# Patient Record
Sex: Female | Born: 2000 | Race: White | Hispanic: Yes | Marital: Single | Smoking: Never smoker
Health system: Southern US, Community
[De-identification: ages and names within clinical notes are randomized; demographics above are authoritative.]

## PROBLEM LIST (undated history)

## (undated) HISTORY — PX: APPENDECTOMY: SHX54

---

## 2020-12-30 ENCOUNTER — Encounter (HOSPITAL_COMMUNITY): Payer: Self-pay

## 2020-12-30 ENCOUNTER — Other Ambulatory Visit: Payer: Self-pay

## 2020-12-30 ENCOUNTER — Emergency Department (HOSPITAL_COMMUNITY)
Admission: EM | Admit: 2020-12-30 | Discharge: 2020-12-31 | Disposition: A | Discharge status: Home / Self Care | Payer: Medicaid Other | Attending: Emergency Medicine | Admitting: Emergency Medicine

## 2020-12-30 DIAGNOSIS — K0889 Other specified disorders of teeth and supporting structures: Secondary | ICD-10-CM | POA: Insufficient documentation

## 2020-12-30 DIAGNOSIS — R1310 Dysphagia, unspecified: Secondary | ICD-10-CM | POA: Insufficient documentation

## 2020-12-30 DIAGNOSIS — R6884 Jaw pain: Secondary | ICD-10-CM | POA: Diagnosis present

## 2020-12-30 LAB — CBC WITH DIFFERENTIAL/PLATELET
Abs Immature Granulocytes: 0.03 10*3/uL (ref 0.00–0.07)
Basophils Absolute: 0.1 10*3/uL (ref 0.0–0.1)
Basophils Relative: 1 %
Eosinophils Absolute: 0.1 10*3/uL (ref 0.0–0.5)
Eosinophils Relative: 1 %
HCT: 40.3 % (ref 36.0–46.0)
Hemoglobin: 13.3 g/dL (ref 12.0–15.0)
Immature Granulocytes: 0 %
Lymphocytes Relative: 27 %
Lymphs Abs: 2.4 10*3/uL (ref 0.7–4.0)
MCH: 30.9 pg (ref 26.0–34.0)
MCHC: 33 g/dL (ref 30.0–36.0)
MCV: 93.7 fL (ref 80.0–100.0)
Monocytes Absolute: 0.6 10*3/uL (ref 0.1–1.0)
Monocytes Relative: 6 %
Neutro Abs: 5.7 10*3/uL (ref 1.7–7.7)
Neutrophils Relative %: 65 %
Platelets: 299 10*3/uL (ref 150–400)
RBC: 4.3 MIL/uL (ref 3.87–5.11)
RDW: 12.8 % (ref 11.5–15.5)
WBC: 8.8 10*3/uL (ref 4.0–10.5)
nRBC: 0 % (ref 0.0–0.2)

## 2020-12-30 LAB — BASIC METABOLIC PANEL
Anion gap: 11 (ref 5–15)
BUN: 6 mg/dL (ref 6–20)
CO2: 23 mmol/L (ref 22–32)
Calcium: 9.7 mg/dL (ref 8.9–10.3)
Chloride: 103 mmol/L (ref 98–111)
Creatinine, Ser: 0.78 mg/dL (ref 0.44–1.00)
GFR, Estimated: >60 mL/min (ref >60)
Glucose, Bld: 98 mg/dL (ref 70–99)
Potassium: 3.6 mmol/L (ref 3.5–5.1)
Sodium: 137 mmol/L (ref 135–145)

## 2020-12-30 MED ORDER — HYDROCODONE-ACETAMINOPHEN 5-325 MG PO TABS
1.0000 | ORAL_TABLET | Freq: Once | ORAL | Status: AC
  Administered 2020-12-30: 1 via ORAL
  Filled 2020-12-30: qty 1

## 2020-12-30 NOTE — ED Triage Notes (Signed)
Patient reports wisdom teeth removal in December, has not been the same since, has not been able to eat food x 3 days, states upper mouth swelling and pain going into her throat.

## 2020-12-30 NOTE — ED Provider Notes (Signed)
MSE was initiated and I personally evaluated the patient and placed orders (if any) at  10:34 PM on December 30, 2020.  Right jaw pain, worsening over the last 3 days. Surgery in December - wisdom teeth removed. Has had some pain every since. No fever.   There were no vitals filed for this visit. There is no height or weight on file to calculate BMI.  Oropharynx benign Tender over lower rear jaw, no swelling or redness Tenderness extends into the neck  The patient appears stable so that the remainder of the MSE may be completed by another provider.   Elpidio Anis, PA-C 12/30/20 2238    Tilden Fossa, MD 12/30/20 [phone]

## 2020-12-31 ENCOUNTER — Emergency Department (HOSPITAL_COMMUNITY): Payer: Medicaid Other

## 2020-12-31 LAB — I-STAT BETA HCG BLOOD, ED (MC, WL, AP ONLY): I-stat hCG, quantitative: <5 m[IU]/mL (ref <5)

## 2020-12-31 MED ORDER — SODIUM CHLORIDE 0.9 % IV BOLUS
1000.0000 mL | Freq: Once | INTRAVENOUS | Status: AC
  Administered 2020-12-31: 1000 mL via INTRAVENOUS

## 2020-12-31 MED ORDER — TRAMADOL HCL 50 MG PO TABS: 50.0000 mg | ORAL_TABLET | Freq: Four times a day (QID) | ORAL | 0 refills | Status: AC | PRN

## 2020-12-31 MED ORDER — KETOROLAC TROMETHAMINE 30 MG/ML IJ SOLN
15.0000 mg | Freq: Once | INTRAMUSCULAR | Status: AC
  Administered 2020-12-31: 15 mg via INTRAVENOUS
  Filled 2020-12-31: qty 1

## 2020-12-31 MED ORDER — NYSTATIN 100000 UNIT/ML MT SUSP
5.0000 mL | Freq: Once | OROMUCOSAL | Status: AC
  Administered 2020-12-31: 500000 [IU] via ORAL
  Filled 2020-12-31: qty 5

## 2020-12-31 MED ORDER — NYSTATIN 100000 UNIT/ML MT SUSP: 500000.0000 [IU] | Freq: Four times a day (QID) | OROMUCOSAL | 0 refills | Status: AC

## 2020-12-31 MED ORDER — IOHEXOL 300 MG/ML  SOLN
100.0000 mL | Freq: Once | INTRAMUSCULAR | Status: AC | PRN
  Administered 2020-12-31: 100 mL via INTRAVENOUS

## 2020-12-31 MED ORDER — HYDROMORPHONE HCL 1 MG/ML IJ SOLN
0.5000 mg | Freq: Once | INTRAMUSCULAR | Status: AC
  Administered 2020-12-31: 0.5 mg via INTRAVENOUS
  Filled 2020-12-31: qty 1

## 2020-12-31 NOTE — ED Provider Notes (Signed)
[facility] EMERGENCY DEPARTMENT Provider Note   CSN: [id number] Arrival date & time: 12/30/20  2159     History Chief Complaint  Patient presents with  . Dysphagia  . Dental Pain    Christina Mclean is a 20 y.o. female.  Patient without significant medical history presents with right severe jaw pain that radiates to neck, ear and proximal shoulder. She reports having her wisdom teeth removed last December and has been having persistent pain since that time. Over the last several days, the pain has become severe and radiating. No facial swelling. She has had follow up with the oral surgeon who felt she might be clenching her jaw but was not concerned for infection or post-operative complication. She has a pending appointment with dentistry for May 3rd but the pain has become unbearable and now includes burning and discoloration of the roof of her mouth. No fever at any point.   The history is provided by the patient. No language interpreter was used.  Dental Pain Associated symptoms: neck pain   Associated symptoms: no fever        No past medical history on file.  There are no problems to display for this patient.  OB History   No obstetric history on file.     No family history on file.  Social History   Tobacco Use  . Smoking status: Never Smoker  . Smokeless tobacco: Never Used  Substance Use Topics  . Alcohol use: Never  . Drug use: Never    Home Medications Prior to Admission medications   Not on File    Allergies    Patient has no known allergies.  Review of Systems   Review of Systems  Constitutional: Negative for chills and fever.  HENT: Positive for dental problem, ear pain and trouble swallowing (Progressively worsening pain with swallowing).        See HPI.  Respiratory: Negative.  Negative for shortness of breath.   Cardiovascular: Negative.   Gastrointestinal: Negative.  Negative for nausea.  Musculoskeletal: Positive for neck  pain.  Skin: Negative.   Neurological: Negative.     Physical Exam Updated Vital Signs BP 137/84 (BP Location: Right Arm)   Pulse 78   Temp 98.1 F (36.7 C) (Oral)   Resp 16   Ht 5\' 5"  (1.651 m)   Wt 83 kg   LMP 11/27/2020   SpO2 100%   BMI 30.45 kg/m   Physical Exam Vitals and nursing note reviewed.  Constitutional:      Appearance: She is well-developed.  HENT:     Head: Normocephalic.      Right Ear: Tympanic membrane normal.     Left Ear: Tympanic membrane normal.     Mouth/Throat:     Comments: No intraoral blistering, exudative process or significant erythema. Extraction sites do not appear red or swollen. No visualized abscess. There is a what sheen to the hard palette without thrush. Oropharynx is benign.  Cardiovascular:     Rate and Rhythm: Normal rate and regular rhythm.  Pulmonary:     Effort: Pulmonary effort is normal.     Breath sounds: Normal breath sounds. No wheezing, rhonchi or rales.  Abdominal:     General: Bowel sounds are normal.     Palpations: Abdomen is soft.     Tenderness: There is no abdominal tenderness. There is no guarding or rebound.  Musculoskeletal:        General: Normal range of motion.  Cervical back: Normal range of motion and neck supple.  Skin:    General: Skin is warm and dry.     Findings: No rash.  Neurological:     Mental Status: She is alert and oriented to person, place, and time.     ED Results / Procedures / Treatments   Labs (all labs ordered are listed, but only abnormal results are displayed) Labs Reviewed  CBC WITH DIFFERENTIAL/PLATELET  BASIC METABOLIC PANEL  I-STAT BETA HCG BLOOD, ED ([facility], [facility], [facility] ONLY)   Results for orders placed or performed during the hospital encounter of 12/30/20  CBC with Differential  Result Value Ref Range   WBC 8.8 4.0 - 10.5 K/uL   RBC 4.30 3.87 - 5.11 MIL/uL   Hemoglobin 13.3 12.0 - 15.0 g/dL   HCT 76.1 95.0 - 93.2 %   MCV 93.7 80.0 - 100.0 fL   MCH 30.9 26.0 - 34.0 pg    MCHC 33.0 30.0 - 36.0 g/dL   RDW 67.1 24.5 - 80.9 %   Platelets 299 150 - 400 K/uL   nRBC 0.0 0.0 - 0.2 %   Neutrophils Relative % 65 %   Neutro Abs 5.7 1.7 - 7.7 K/uL   Lymphocytes Relative 27 %   Lymphs Abs 2.4 0.7 - 4.0 K/uL   Monocytes Relative 6 %   Monocytes Absolute 0.6 0.1 - 1.0 K/uL   Eosinophils Relative 1 %   Eosinophils Absolute 0.1 0.0 - 0.5 K/uL   Basophils Relative 1 %   Basophils Absolute 0.1 0.0 - 0.1 K/uL   Immature Granulocytes 0 %   Abs Immature Granulocytes 0.03 0.00 - 0.07 K/uL  Basic metabolic panel  Result Value Ref Range   Sodium 137 135 - 145 mmol/L   Potassium 3.6 3.5 - 5.1 mmol/L   Chloride 103 98 - 111 mmol/L   CO2 23 22 - 32 mmol/L   Glucose, Bld 98 70 - 99 mg/dL   BUN 6 6 - 20 mg/dL   Creatinine, Ser 9.83 0.44 - 1.00 mg/dL   Calcium 9.7 8.9 - 38.2 mg/dL   GFR, Estimated >50 >53 mL/min   Anion gap 11 5 - 15  I-Stat beta hCG blood, ED  Result Value Ref Range   I-stat hCG, quantitative <5.0 <5 mIU/mL   Comment 3            EKG None  Radiology No results found.  Procedures Procedures   Medications Ordered in ED Medications  sodium chloride 0.9 % bolus 1,000 mL (has no administration in time range)  HYDROmorphone (DILAUDID) injection 0.5 mg (has no administration in time range)  ketorolac (TORADOL) 30 MG/ML injection 15 mg (has no administration in time range)  HYDROcodone-acetaminophen (NORCO/VICODIN) 5-325 MG per tablet 1 tablet (1 tablet Oral Given 12/30/20 2247)    ED Course  I have reviewed the triage vital signs and the nursing notes.  Pertinent labs & imaging results that were available during my care of the patient were reviewed by me and considered in my medical decision making (see chart for details).    MDM Rules/Calculators/A&P                          Patient to ED with progressively worsening, severe pain at right jaw that extends to ear, anterolateral neck and proximal shoulder. No fever.   Pain seems out of  proportion given her surgery was 4 months ago. No evidence of infection or  abnormality on exam. VSS. Labs unremarkable. She was given a Norco and pain is no better. Will do contrasted CT study of the neck to r/o underlying abscess or other cause of pain.   Pain is better controlled with Dilaudid. Discussed using Nystatin for hard palette discoloration and burning sensation there and while swallowing.   CT neck w/CM negative. Patient updated on results. She will continue ibuprofen, will add Tramadol (#8). She has dental follow up pending and is encouraged to keep her scheduled appointment.   Final Clinical Impression(s) / ED Diagnoses Final diagnoses:  None   1. Jaw pain   Rx / DC Orders ED Discharge Orders    None       Elpidio Anis, PA-C 12/31/20 [id number]    Geoffery Lyons, MD 12/31/20 [phone]

## 2020-12-31 NOTE — Discharge Instructions (Addendum)
Continue warm compresses to the jaw. Continue your medications as prescribed. Keep your scheduled appointment with dentistry.  Return to the ED with any new or worsening symptoms.

## 2020-12-31 NOTE — ED Notes (Signed)
Patient transported to CT 

## 2022-04-30 IMAGING — CT CT NECK W/ CM
4 of 6 series · 12 of 33 positions shown, 14 images · IV contrast (APPLIED)
Comparison: None.

CLINICAL DATA: Neck abscess.  Right mouth pain for 3 days

EXAM:
CT NECK WITH CONTRAST
TECHNIQUE: Multidetector CT imaging of the neck was performed using the
standard protocol following the bolus administration of intravenous
contrast.
CONTRAST:  100mL OMNIPAQUE IOHEXOL 300 MG/ML  SOLN

[Series 3: axial neck · axial · 0.61mm/px · z∈[-239,-149]mm · 2 of 136 slices shown, 3 images]
[im 46/136  soft-tissue]
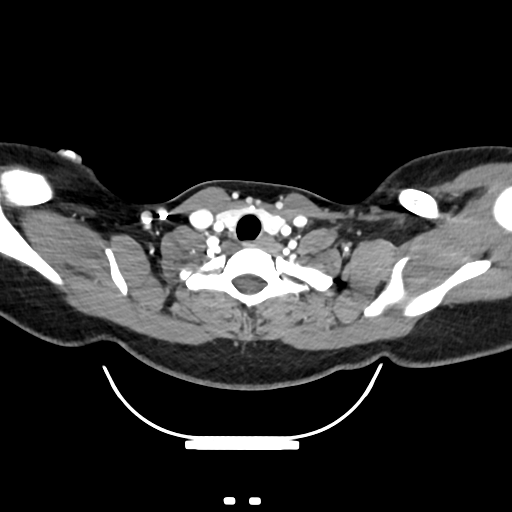
[im 46/136  bone]
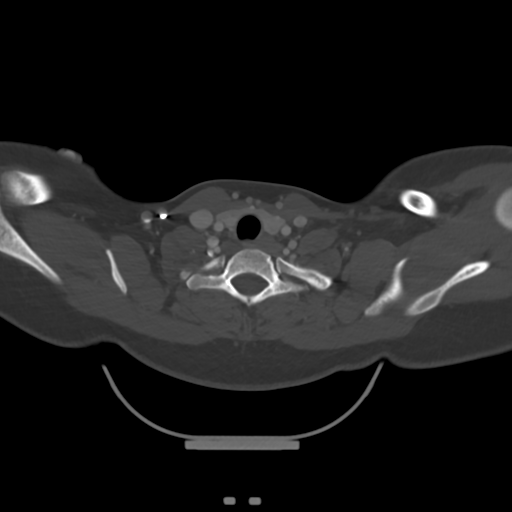
[im 91/136  bone]
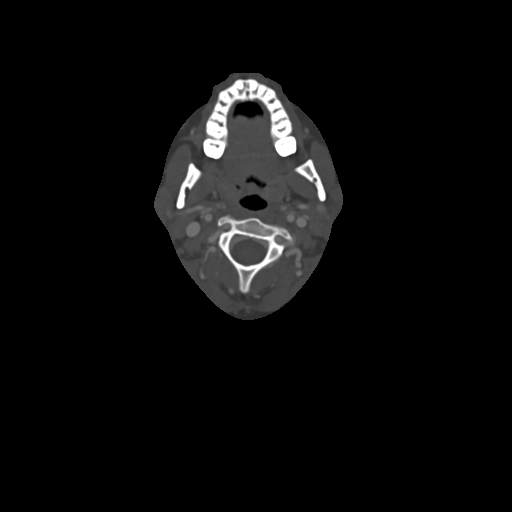

[Series 6: sag neck · sagittal · 0.53mm/px · 5 of 217 slices shown, 6 images]
[im 73/217  bone]
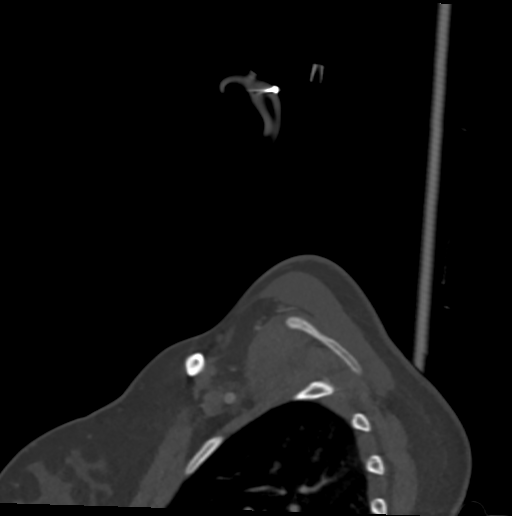
[im 91/217  bone]
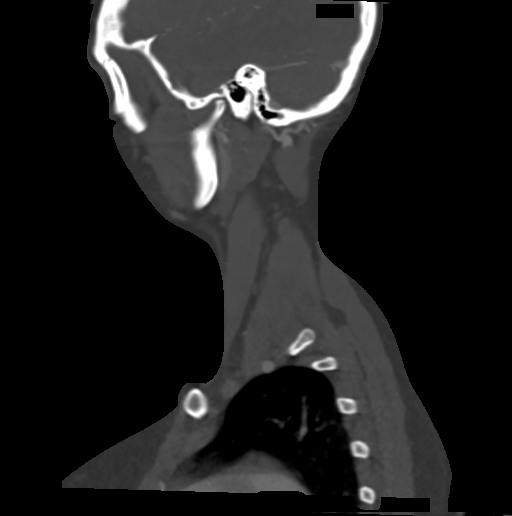
[im 109/217  soft-tissue]
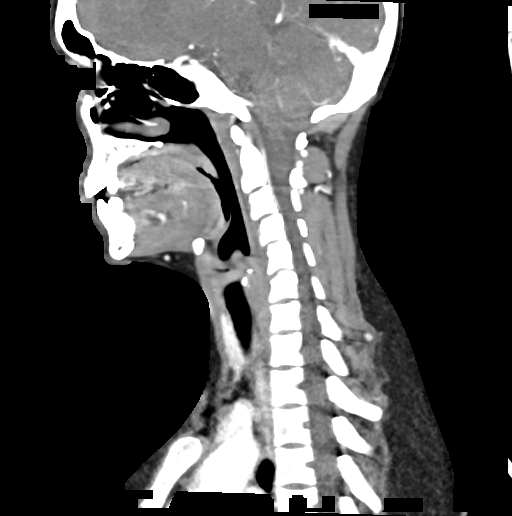
[im 109/217  bone]
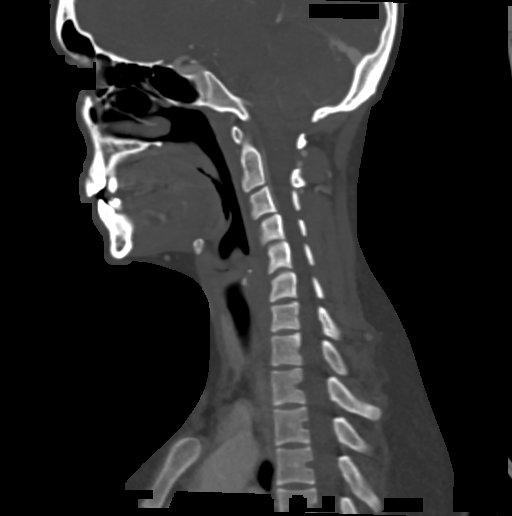
[im 127/217  bone]
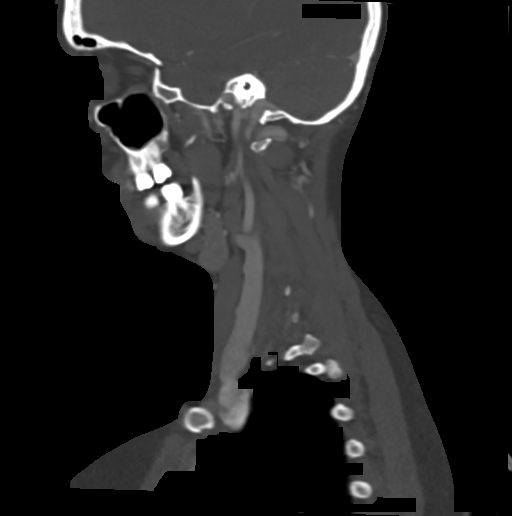
[im 145/217  bone]
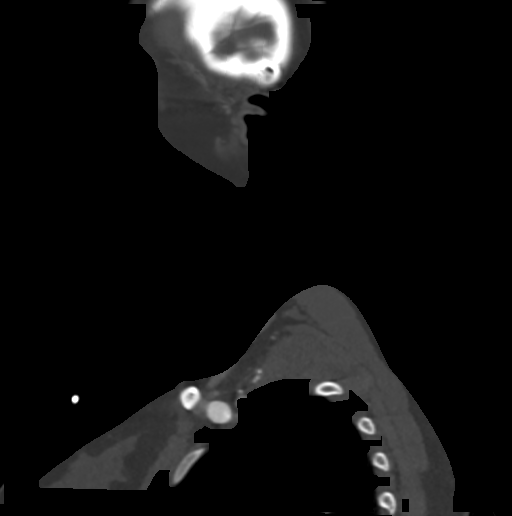

[Series 7: cor neck · coronal · 0.54mm/px · 3 of 137 slices shown]
[im 28/137  bone]
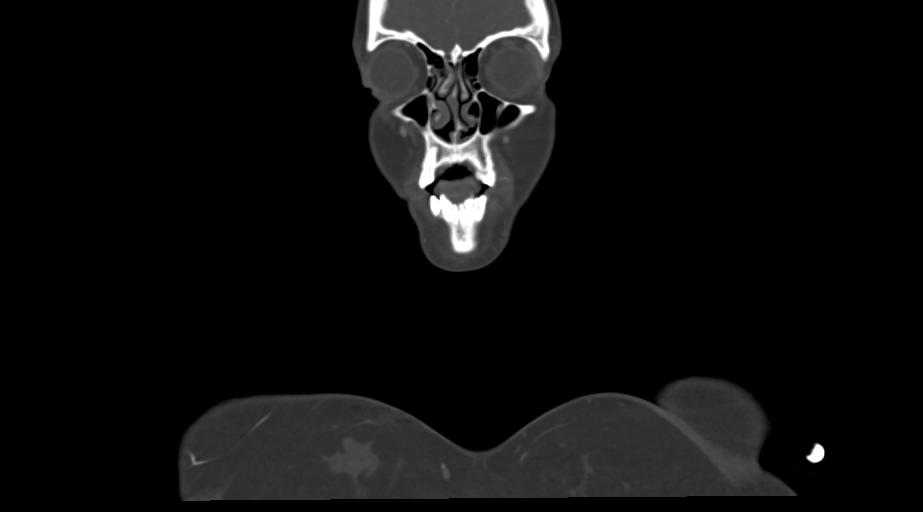
[im 55/137  bone]
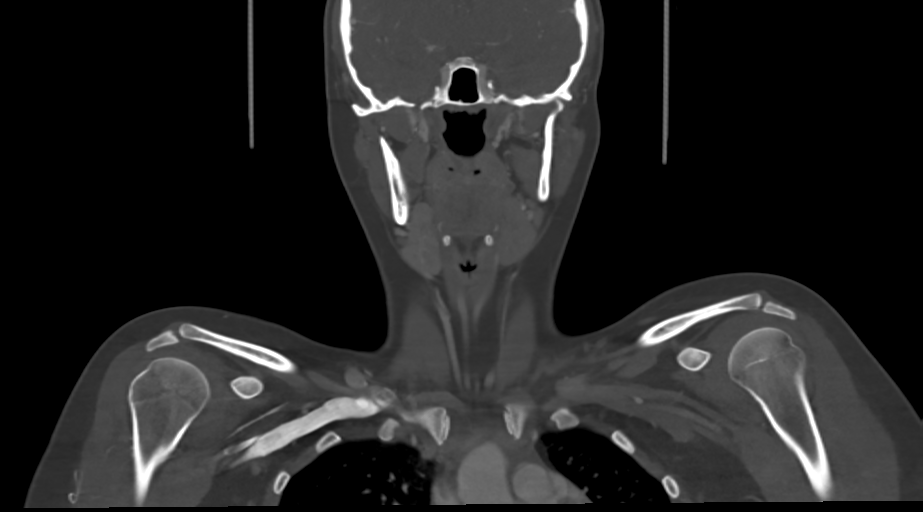
[im 82/137  bone]
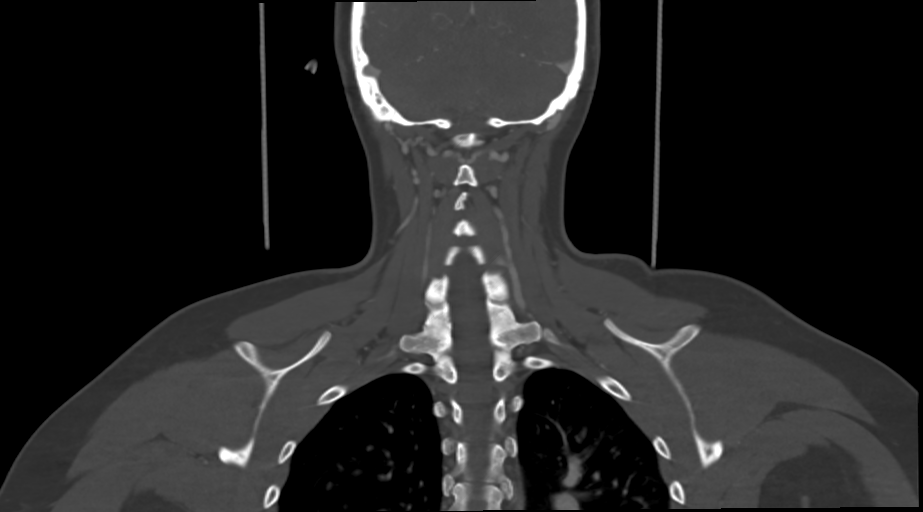

[Series 8: ax oropharynx · axial · 0.53mm/px · z∈[-249,-157]mm · 2 of 138 slices shown]
[im 46/138  bone]
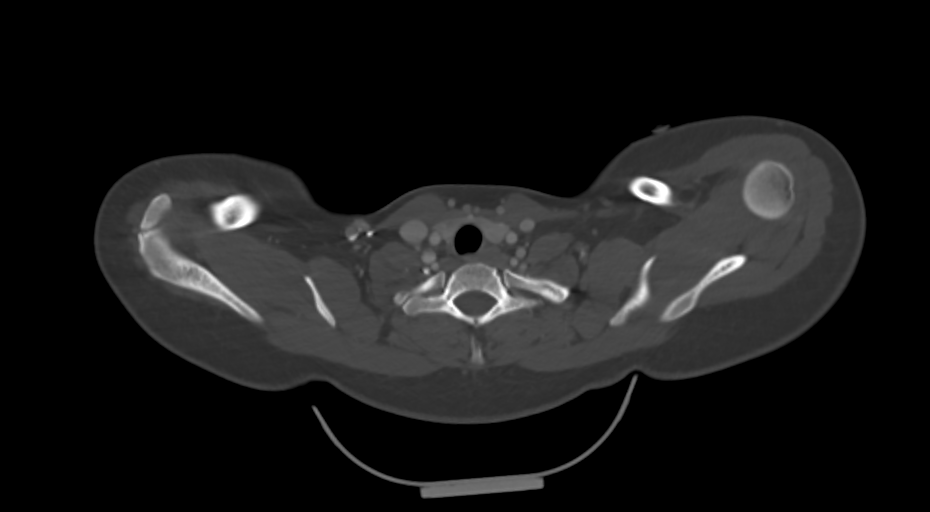
[im 92/138  bone]
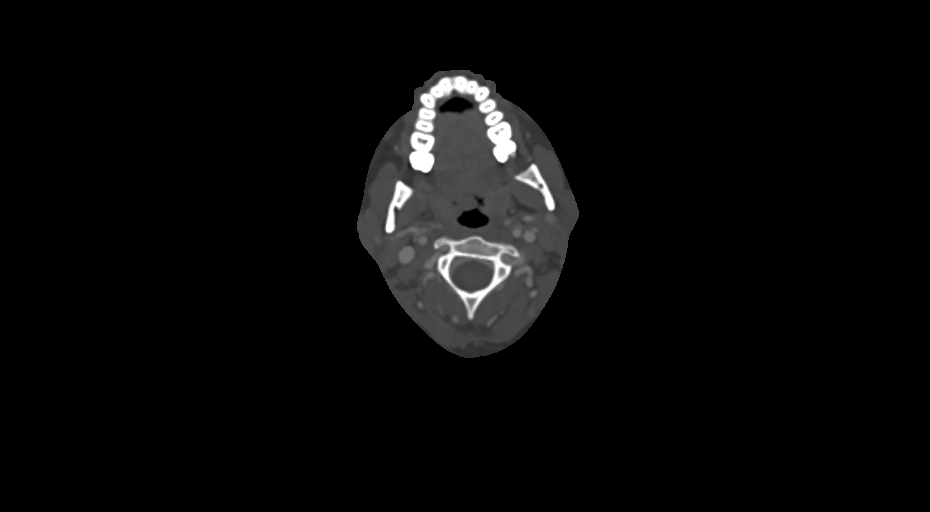

[12 of 33 positions shown; findings below may reference images not displayed]

FINDINGS: Pharynx and larynx: Normal. No mass or swelling.

Salivary glands: No inflammation, mass, or stone.

Thyroid: Normal.

Lymph nodes: Symmetric benign-appearing nodes.

Vascular: Negative.

Limited intracranial: Negative.

Visualized orbits: Negative.

Mastoids and visualized paranasal sinuses: Clear.

Skeleton: No acute or aggressive process.

Upper chest: Negative.
IMPRESSION: Negative neck CT.
